# Patient Record
Sex: Male | Born: 1955 | Race: Black or African American | Hispanic: No | Marital: Single | Smoking: Former smoker
Health system: Southern US, Community
[De-identification: ages and names within clinical notes are randomized; demographics above are authoritative.]

## PROBLEM LIST (undated history)

## (undated) DIAGNOSIS — G4730 Sleep apnea, unspecified: Secondary | ICD-10-CM

## (undated) DIAGNOSIS — S82843A Displaced bimalleolar fracture of unspecified lower leg, initial encounter for closed fracture: Secondary | ICD-10-CM

## (undated) DIAGNOSIS — S40812A Abrasion of left upper arm, initial encounter: Secondary | ICD-10-CM

## (undated) HISTORY — DX: Sleep apnea, unspecified: G47.30

---

## 1988-11-06 HISTORY — PX: WISDOM TOOTH EXTRACTION: SHX21

## 1998-06-14 ENCOUNTER — Emergency Department (HOSPITAL_COMMUNITY): Admission: EM | Admit: 1998-06-14 | Discharge: 1998-06-14 | Payer: Self-pay | Admitting: Emergency Medicine

## 2002-01-06 ENCOUNTER — Encounter: Admission: RE | Admit: 2002-01-06 | Discharge: 2002-01-06 | Payer: Self-pay | Admitting: Sports Medicine

## 2003-01-05 ENCOUNTER — Emergency Department (HOSPITAL_COMMUNITY): Admission: EM | Admit: 2003-01-05 | Discharge: 2003-01-06 | Payer: Self-pay

## 2004-07-21 ENCOUNTER — Ambulatory Visit (HOSPITAL_COMMUNITY): Admission: RE | Admit: 2004-07-21 | Discharge: 2004-07-21 | Payer: Self-pay | Admitting: Internal Medicine

## 2008-11-18 ENCOUNTER — Encounter: Admission: RE | Admit: 2008-11-18 | Discharge: 2008-11-18 | Payer: Self-pay | Admitting: Orthopedic Surgery

## 2012-12-27 ENCOUNTER — Ambulatory Visit: Payer: Self-pay | Admitting: General Practice

## 2014-05-20 ENCOUNTER — Encounter: Payer: Self-pay | Admitting: Gastroenterology

## 2014-07-19 ENCOUNTER — Ambulatory Visit (HOSPITAL_BASED_OUTPATIENT_CLINIC_OR_DEPARTMENT_OTHER): Payer: BC Managed Care – PPO | Attending: Internal Medicine | Admitting: Radiology

## 2014-07-19 VITALS — Ht 69.0 in | Wt 260.0 lb

## 2014-07-19 DIAGNOSIS — G4733 Obstructive sleep apnea (adult) (pediatric): Secondary | ICD-10-CM | POA: Diagnosis not present

## 2014-07-23 ENCOUNTER — Ambulatory Visit (AMBULATORY_SURGERY_CENTER): Payer: Self-pay | Admitting: *Deleted

## 2014-07-23 VITALS — Ht 69.0 in | Wt 263.4 lb

## 2014-07-23 DIAGNOSIS — Z1211 Encounter for screening for malignant neoplasm of colon: Secondary | ICD-10-CM

## 2014-07-23 MED ORDER — NA SULFATE-K SULFATE-MG SULF 17.5-3.13-1.6 GM/177ML PO SOLN: ORAL | Status: DC

## 2014-07-23 NOTE — Progress Notes (Signed)
No allergies to eggs or soy. No problems with anesthesia.  Pt given Emmi instructions for colonoscopy  No oxygen use  No diet drug use  

## 2014-07-25 DIAGNOSIS — G4733 Obstructive sleep apnea (adult) (pediatric): Secondary | ICD-10-CM

## 2014-07-25 NOTE — Sleep Study (Signed)
        NAME: Ruben Poole DATE OF BIRTH:  06-17-1956 MEDICAL RECORD NUMBER [id number]  LOCATION: [facility]  PHYSICIAN: Sloane Junkin D  DATE OF STUDY: 07/19/2014  SLEEP STUDY TYPE: Nocturnal Polysomnogram               REFERRING PHYSICIAN: Dorrene German, MD  INDICATION FOR STUDY: Hypersomnia with sleep apnea  EPWORTH SLEEPINESS SCORE:   10/24 HEIGHT:  (175.3 cm)  WEIGHT: 260 lb (117.935 kg)    Body mass index is 38.38 kg/(m^2).  NECK SIZE: 18 in.  MEDICATIONS: Charted for review  SLEEP ARCHITECTURE: Total sleep time 245 minutes with sleep efficiency 54.5%. Stage I was 14.3%, stage II 66.1%, stage III absent, REM 19.6% of total sleep time. Sleep latency 100 minutes, REM latency 162 minutes, awake after sleep onset 104.5 minutes, arousal index 52.9, bedtime medication: None. Significant difficulty initiating sleep, with sustained sleep not noted until almost 1 AM.  RESPIRATORY DATA: Apnea hypopneas index (AHI) 45.3 per hour. 185 total events scored including 84 obstructive apneas, 3 mixed apneas, and 98 hypopneas. Non-positional events. REM AHI 60 per hour. Because of the leg sleep onset, there was not enough early sleep to meet protocol requirements for split CPAP titration.  OXYGEN DATA: Moderate snoring with oxygen desaturation to a nadir of 70% and mean saturation 92.5% on room air.  CARDIAC DATA: Normal sinus rhythm  MOVEMENT/PARASOMNIA: No significant movement disturbance, bathroom x1  IMPRESSION/ RECOMMENDATION:   1) Severe obstructive sleep apnea/hypopneas syndrome, AHI 45.3 per hour with non-positional events. REM AHI 60 per hour. Moderate snoring with oxygen desaturation to a nadir of 70% and mean saturation 92.5% on room air. 2) Sustained sleep was delayed until almost 1 AM. If typical of his usual sleep pattern, this would suggest an insomnia component. 3) Delayed sleep onset prevented application of split protocol CPAP titration. If you  returns for a dedicated CPAP titration study, consider letting him bring a sleep medication to assist with sleep onset.  Waymon Budge Diplomate, American Board of Sleep Medicine  ELECTRONICALLY SIGNED ON:  07/25/2014, 11:23 AM [facility] PH: [phone]   FX: [phone] ACCREDITED BY THE AMERICAN ACADEMY OF SLEEP MEDICINE

## 2014-07-31 ENCOUNTER — Encounter: Payer: Self-pay | Admitting: Gastroenterology

## 2014-08-04 ENCOUNTER — Ambulatory Visit (AMBULATORY_SURGERY_CENTER): Payer: BC Managed Care – PPO | Admitting: Gastroenterology

## 2014-08-04 ENCOUNTER — Encounter: Payer: Self-pay | Admitting: Gastroenterology

## 2014-08-04 VITALS — BP 116/79 | HR 59 | Temp 97.3°F | Resp 14 | Ht 69.0 in | Wt 263.0 lb

## 2014-08-04 DIAGNOSIS — Z1211 Encounter for screening for malignant neoplasm of colon: Secondary | ICD-10-CM

## 2014-08-04 HISTORY — PX: COLONOSCOPY WITH PROPOFOL: SHX5780

## 2014-08-04 MED ORDER — SODIUM CHLORIDE 0.9 % IV SOLN: 500.0000 mL | INTRAVENOUS | Status: DC

## 2014-08-04 NOTE — Patient Instructions (Signed)
YOU HAD AN ENDOSCOPIC PROCEDURE TODAY AT THE [facility]: Refer to the procedure report that was given to you for any specific questions about what was found during the examination.  If the procedure report does not answer your questions, please call your gastroenterologist to clarify.  If you requested that your care partner not be given the details of your procedure findings, then the procedure report has been included in a sealed envelope for you to review at your convenience later.  YOU SHOULD EXPECT: Some feelings of bloating in the abdomen. Passage of more gas than usual.  Walking can help get rid of the air that was put into your GI tract during the procedure and reduce the bloating. If you had a lower endoscopy (such as a colonoscopy or flexible sigmoidoscopy) you may notice spotting of blood in your stool or on the toilet paper. If you underwent a bowel prep for your procedure, then you may not have a normal bowel movement for a few days.  DIET: Your first meal following the procedure should be a light meal and then it is ok to progress to your normal diet.  A half-sandwich or bowl of soup is an example of a good first meal.  Heavy or fried foods are harder to digest and may make you feel nauseous or bloated.  Likewise meals heavy in dairy and vegetables can cause extra gas to form and this can also increase the bloating.  Drink plenty of fluids but you should avoid alcoholic beverages for 24 hours.  ACTIVITY: Your care partner should take you home directly after the procedure.  You should plan to take it easy, moving slowly for the rest of the day.  You can resume normal activity the day after the procedure however you should NOT DRIVE or use heavy machinery for 24 hours (because of the sedation medicines used during the test).    SYMPTOMS TO REPORT IMMEDIATELY: A gastroenterologist can be reached at any hour.  During normal business hours, 8:30 AM to 5:00 PM Monday through Friday,  call [phone].  After hours and on weekends, please call the GI answering service at [phone] who will take a message and have the physician on call contact you.   Following lower endoscopy (colonoscopy or flexible sigmoidoscopy):  Excessive amounts of blood in the stool  Significant tenderness or worsening of abdominal pains  Swelling of the abdomen that is new, acute  Fever of 100F or higher    FOLLOW UP: If any biopsies were taken you will be contacted by phone or by letter within the next 1-3 weeks.  Call your gastroenterologist if you have not heard about the biopsies in 3 weeks.  Our staff will call the home number listed on your records the next business day following your procedure to check on you and address any questions or concerns that you may have at that time regarding the information given to you following your procedure. This is a courtesy call and so if there is no answer at the home number and we have not heard from you through the emergency physician on call, we will assume that you have returned to your regular daily activities without incident.  SIGNATURES/CONFIDENTIALITY: You and/or your care partner have signed paperwork which will be entered into your electronic medical record.  These signatures attest to the fact that that the information above on your After Visit Summary has been reviewed and is understood.  Full responsibility of the confidentiality   of this discharge information lies with you and/or your care-partner.  Normal colonoscopy-Repeat in 10 years-2025. 

## 2014-08-04 NOTE — Progress Notes (Signed)
Patient awakening,vss,report to rn 

## 2014-08-04 NOTE — Op Note (Signed)
[facility] [address] [place], [id number]   COLONOSCOPY PROCEDURE REPORT  PATIENT: Ruben Poole, Ruben Poole  MR#: [id number] BIRTHDATE: Jul 20, 1956 , 58  yrs. old GENDER: male ENDOSCOPIST: Louis Meckelobert D Cordarrius Coad, MD REFERRED OZ:HYQMVBY:Edwin Concepcion ElkAvbuere, M.D. PROCEDURE DATE:  08/04/2014 PROCEDURE:   Colonoscopy, diagnostic First Screening Colonoscopy - Avg.  risk and is 50 yrs.  old or older Yes.  Prior Negative Screening - Now for repeat screening. N/A  History of Adenoma - Now for follow-up colonoscopy & has been > or = to 3 yrs.  N/A  Polyps Removed Today? No.  Recommend repeat exam, <10 yrs? No. ASA CLASS:   Class II INDICATIONS:average risk for colon cancer. MEDICATIONS: Monitored anesthesia care and Propofol 200 mg IV  DESCRIPTION OF PROCEDURE:   After the risks benefits and alternatives of the procedure were thoroughly explained, informed consent was obtained.  The digital rectal exam revealed no abnormalities of the rectum.   The LB [id number]  endoscope was introduced through the anus and advanced to the cecum, which was identified by both the appendix and ileocecal valve. No adverse events experienced.   The quality of the prep was excellent using Suprep  The instrument was then slowly withdrawn as the colon was fully examined.      COLON FINDINGS: A normal appearing cecum, ileocecal valve, and appendiceal orifice were identified.  The ascending, transverse, descending, sigmoid colon, and rectum appeared unremarkable. Retroflexed views revealed no abnormalities. The time to cecum=2 minutes 16 seconds.  Withdrawal time=5 minutes 58 seconds.  The scope was withdrawn and the procedure completed. COMPLICATIONS: There were no immediate complications.  ENDOSCOPIC IMPRESSION: Normal colonoscopy  RECOMMENDATIONS: Continue current colorectal screening recommendations for "routine risk" patients with a repeat colonoscopy in 10 years.  eSigned:  Louis Meckelobert D Samariyah Cowles, MD  08/04/2014 12:04 PM   cc:

## 2014-08-05 ENCOUNTER — Telehealth: Payer: Self-pay | Admitting: *Deleted

## 2014-08-05 NOTE — Telephone Encounter (Signed)
No answer. Left message to call if questions or concerns. 

## 2017-01-31 ENCOUNTER — Emergency Department (HOSPITAL_COMMUNITY): Payer: BLUE CROSS/BLUE SHIELD

## 2017-01-31 ENCOUNTER — Encounter (HOSPITAL_COMMUNITY): Payer: Self-pay

## 2017-01-31 ENCOUNTER — Emergency Department (HOSPITAL_COMMUNITY)
Admission: EM | Admit: 2017-01-31 | Discharge: 2017-01-31 | Disposition: A | Discharge status: Home / Self Care | Payer: BLUE CROSS/BLUE SHIELD | Attending: Emergency Medicine | Admitting: Emergency Medicine

## 2017-01-31 DIAGNOSIS — Y999 Unspecified external cause status: Secondary | ICD-10-CM | POA: Insufficient documentation

## 2017-01-31 DIAGNOSIS — W1789XA Other fall from one level to another, initial encounter: Secondary | ICD-10-CM | POA: Diagnosis not present

## 2017-01-31 DIAGNOSIS — Y9389 Activity, other specified: Secondary | ICD-10-CM | POA: Insufficient documentation

## 2017-01-31 DIAGNOSIS — Y929 Unspecified place or not applicable: Secondary | ICD-10-CM | POA: Insufficient documentation

## 2017-01-31 DIAGNOSIS — F17290 Nicotine dependence, other tobacco product, uncomplicated: Secondary | ICD-10-CM | POA: Diagnosis not present

## 2017-01-31 DIAGNOSIS — IMO0001 Reserved for inherently not codable concepts without codable children: Secondary | ICD-10-CM

## 2017-01-31 DIAGNOSIS — S82842A Displaced bimalleolar fracture of left lower leg, initial encounter for closed fracture: Secondary | ICD-10-CM | POA: Diagnosis not present

## 2017-01-31 DIAGNOSIS — S99912A Unspecified injury of left ankle, initial encounter: Secondary | ICD-10-CM | POA: Diagnosis present

## 2017-01-31 DIAGNOSIS — S82843A Displaced bimalleolar fracture of unspecified lower leg, initial encounter for closed fracture: Secondary | ICD-10-CM

## 2017-01-31 DIAGNOSIS — S40812A Abrasion of left upper arm, initial encounter: Secondary | ICD-10-CM

## 2017-01-31 HISTORY — DX: Abrasion of left upper arm, initial encounter: S40.812A

## 2017-01-31 HISTORY — DX: Displaced bimalleolar fracture of unspecified lower leg, initial encounter for closed fracture: S82.843A

## 2017-01-31 MED ORDER — FENTANYL CITRATE (PF) 100 MCG/2ML IJ SOLN
50.0000 ug | Freq: Once | INTRAMUSCULAR | Status: AC
  Administered 2017-01-31: 50 ug via INTRAVENOUS
  Filled 2017-01-31: qty 2

## 2017-01-31 MED ORDER — OXYCODONE-ACETAMINOPHEN 5-325 MG PO TABS: 1.0000 | ORAL_TABLET | ORAL | 0 refills | Status: DC | PRN

## 2017-01-31 MED ORDER — PROPOFOL 10 MG/ML IV BOLUS
1.0000 mg/kg | Freq: Once | INTRAVENOUS | Status: AC
  Administered 2017-01-31: 100 mg via INTRAVENOUS
  Filled 2017-01-31: qty 20

## 2017-01-31 NOTE — ED Notes (Signed)
Bed: WA20 Expected date:  Expected time:  Means of arrival:  Comments: EMS/ankle deformity

## 2017-01-31 NOTE — ED Provider Notes (Signed)
[facility]-EMERGENCY DEPT Provider Note   CSN: [id number] Arrival date & time: 01/31/17  1303     History   Chief Complaint Chief Complaint  Patient presents with  . Ankle Injury    HPI Ruben Poole is a 61 y.o. male.  The history is provided by the patient and medical records.  Ankle Injury     61 year old male here with left ankle injury. He was cleaning up a tree that had fallen and fell off of his back deck into some loose wood.  Deck is approx 8 feet high.  States immediately when he fell he had deformity to the left ankle. States he crawled back up the hill and called for help. He has not tried to walk on his left ankle. Ankle was splinted by EMS and given 100 g of fentanyl en route to the ED which has helped with pain.  Patient denies any prior left ankle injuries. He denies any current numbness or tingling of his left foot. States pain is controlled as long as he does not move. He denies any other injuries. No head injury or loss of consciousness. Patient is not currently on any anticoagulation. He has not had anything to eat or drink thus far today.  Past Medical History:  Diagnosis Date  . Sleep apnea    is being evaluated currently 07/23/14    There are no active problems to display for this patient.   Past Surgical History:  Procedure Laterality Date  . COLONOSCOPY    . WISDOM TOOTH EXTRACTION  1990       Home Medications    Prior to Admission medications   Medication Sig Start Date End Date Taking? Authorizing Provider  Multiple Vitamin (MULTIVITAMIN) tablet Take 1 tablet by mouth daily.    Historical Provider, MD  Omega-3 Fatty Acids (FISH OIL) 1000 MG CAPS Take by mouth daily.    Historical Provider, MD    Family History Family History  Problem Relation Age of Onset  . Colon cancer Neg Hx     Social History Social History  Substance Use Topics  . Smoking status: Current Some Day Smoker    Types: Cigars  . Smokeless tobacco: Never Used  .  Alcohol use Yes     Comment: drinks 2 drinks liquor a month     Allergies   Patient has no known allergies.   Review of Systems Review of Systems  Musculoskeletal: Positive for arthralgias and joint swelling.  All other systems reviewed and are negative.    Physical Exam Updated Vital Signs BP (!) 152/97 (BP Location: Left Arm)   Pulse 93   Temp 98.7 F (37.1 C) (Oral)   Resp 16   Ht 5\' 10"  (1.778 m)   Wt 117.9 kg   SpO2 99%   BMI 37.31 kg/m   Physical Exam  Constitutional: He is oriented to person, place, and time. He appears well-developed and well-nourished.  HENT:  Head: Normocephalic and atraumatic.  Mouth/Throat: Oropharynx is clear and moist.  Eyes: Conjunctivae and EOM are normal. Pupils are equal, round, and reactive to light.  Neck: Normal range of motion.  Cardiovascular: Normal rate, regular rhythm and normal heart sounds.   Pulmonary/Chest: Effort normal and breath sounds normal. No respiratory distress. He has no wheezes.  Abdominal: Soft. Bowel sounds are normal.  Musculoskeletal:       Left ankle: He exhibits decreased range of motion, swelling and deformity. Tenderness. Medial malleolus tenderness found.  Left ankle deformed along  medial aspect; there is no skin tenting or open wounds; DP pulse remains intact; able to move toes normally; foot is warm, well perfused  Neurological: He is alert and oriented to person, place, and time.  Skin: Skin is warm and dry.  Psychiatric: He has a normal mood and affect.  Nursing note and vitals reviewed.    ED Treatments / Results  Labs (all labs ordered are listed, but only abnormal results are displayed) Labs Reviewed - No data to display  EKG  EKG Interpretation None       Radiology Dg Ankle 2 Views Left  Result Date: 01/31/2017 CLINICAL DATA:  Closed reduction LEFT ankle fracture. EXAM: LEFT ANKLE - 2 VIEW COMPARISON:  LEFT ankle radiograph January 31, 2017 at 1425 hours FINDINGS: Acute medial  malleolus with slight lateral angulation. Nondisplaced acute distal fibular fracture. Mildly widened medial clear space, improved from prior examination. No dislocation. No destructive bony lesions. Soft tissue swelling. Thick fiberglass splint obscures the fine bony detail. IMPRESSION: Acute medial malleolus and distal fibular fractures in improved alignment, in fiberglass splint. Electronically Signed   By: Awilda Metro M.D.   On: 01/31/2017 16:22   Dg Ankle Complete Left  Result Date: 01/31/2017 CLINICAL DATA:  Larey Seat off deck today while cutting trees. Ankle deformity and soft tissue swelling. EXAM: LEFT ANKLE COMPLETE - 3+ VIEW COMPARISON:  None. FINDINGS: Transverse fracture through medial malleolus with lateral displacement distal bony fragments. Comminuted oblique fracture of distal fibula with lateral angulation of distal bony fragments. Disrupted ankle mortise and lateral clear space. Tibial dome is laterally displaced with respect to the tibial plafond . Ankle soft tissue swelling without subcutaneous gas or radiopaque foreign bodies. IMPRESSION: Acute displaced ankle fracture (medial malleolus and distal fibula) and dislocation. Electronically Signed   By: Awilda Metro M.D.   On: 01/31/2017 15:10   Ct Ankle Left Wo Contrast  Result Date: 01/31/2017 CLINICAL DATA:  Bimalleolar fracture of the left ankle EXAM: CT OF THE LEFT ANKLE WITHOUT CONTRAST TECHNIQUE: Multidetector CT imaging of the left ankle was performed according to the standard protocol. Multiplanar CT image reconstructions were also generated. COMPARISON:  None. FINDINGS: Bones/Joint/Cartilage Acute, closed, bimalleolar fractures of the ankle are identified. The fibular fracture is slightly comminuted with main fracture coronal oblique in orientation extending into the ankle joint. There is a 14 mm length dorsally displaced fracture off the upper portion of the fibular fracture. The fracture involving the medial malleolus of  the tibia involves the anterolateral corner and there is slight medial displacement of the medial malleolus by approximately 5 mm. The ankle mortise is minimally widened along its medial aspect as a result of to 6 mm but grossly intact. There are tiny ossific densities posterior to the posterior malleolus without donor site, possibly related to old remote trauma. The visualized talus, os calcaneus and midfoot are intact. Ligaments Suboptimally assessed by CT. Muscles and Tendons No acute intramuscular hemorrhage nor evidence of compartment syndrome. The visualized flexor and extensor tendons are grossly intact. Soft tissues Expected degree of moderate subcutaneous soft tissue edema and induration about the ankle secondary to fracture. IMPRESSION: 1. Acute, closed, bimalleolar fractures of the ankle. 2. The fibular fracture is slightly comminuted proximally with slight dorsal displacement of a 14 mm in length fracture fragment. Slight medial displacement of the medial malleolar fracture by 5 mm. 3. Slight asymmetric widening of the ankle mortise medially to 6 mm. Electronically Signed   By: Tollie Eth M.D.   On:  01/31/2017 18:38   Dg Ankle Left Port  Result Date: 01/31/2017 CLINICAL DATA:  Post reduction EXAM: PORTABLE LEFT ANKLE - 1 VIEW COMPARISON:  Portable exam 1703 hours compared to 1606 hours FINDINGS: Single lateral portable cross-table view. Oblique minimally displaced distal fibular fracture. Minimally displaced medial malleolar fracture. No additional fractures identified. No dislocation. IMPRESSION: Minimally displaced bimalleolar fractures of the LEFT ankle. Electronically Signed   By: Ulyses Southward M.D.   On: 01/31/2017 17:39    Procedures Reduction of dislocation Date/Time: 01/31/2017 4:13 PM Performed by: Garlon Hatchet Authorized by: Garlon Hatchet  Consent: Verbal consent obtained. Risks and benefits: risks, benefits and alternatives were discussed Consent given by: patient Patient  understanding: patient states understanding of the procedure being performed Patient consent: the patient's understanding of the procedure matches consent given Procedure consent: procedure consent matches procedure scheduled Required items: required blood products, implants, devices, and special equipment available Patient identity confirmed: verbally with patient Time out: Immediately prior to procedure a "time out" was called to verify the correct patient, procedure, equipment, support staff and site/side marked as required. Local anesthesia used: no  Anesthesia: Local anesthesia used: no  Sedation: Patient sedated: yes Sedation type: moderate (conscious) sedation Sedatives: propofol Analgesia: fentanyl Sedation start date/time: 01/31/2017 3:50 PM Sedation end date/time: 01/31/2017 4:05 PM Vitals: Vital signs were monitored during sedation. Patient tolerance: Patient tolerated the procedure well with no immediate complications Comments: Alignment improved, however fracture remains very unstable.  Splint applied.  .Sedation Date/Time: 01/31/2017 4:15 PM Performed by: Garlon Hatchet Authorized by: Garlon Hatchet   Consent:    Consent obtained:  Verbal and written   Consent given by:  Patient   Risks discussed:  Allergic reaction, prolonged hypoxia resulting in organ damage, dysrhythmia, prolonged sedation necessitating reversal and nausea   Alternatives discussed:  Analgesia without sedation and anxiolysis Indications:    Procedure performed:  Dislocation reduction   Procedure necessitating sedation performed by:  Physician performing sedation   Intended level of sedation:  Moderate (conscious sedation) Pre-sedation assessment:    Time since last food or drink:  Last PM   ASA classification: class 1 - normal, healthy patient     Neck mobility: normal     Mouth opening:  3 or more finger widths   Mallampati score:  I - soft palate, uvula, fauces, pillars visible    Pre-sedation assessments completed and reviewed: airway patency, hydration status, mental status, pain level and respiratory function   Immediate pre-procedure details:    Reassessment: Patient reassessed immediately prior to procedure     Reviewed: vital signs, relevant labs/tests and NPO status     Verified: bag valve mask available, emergency equipment available, intubation equipment available, IV patency confirmed and oxygen available   Procedure details (see MAR for exact dosages):    Sedation start time:  01/31/2017 3:50 PM   Preoxygenation:  Nasal cannula   Sedation:  Propofol   Analgesia:  Fentanyl   Intra-procedure monitoring:  Blood pressure monitoring, continuous pulse oximetry, cardiac monitor and frequent vital sign checks   Intra-procedure events: none     Sedation end time:  01/31/2017 4:05 PM   Total sedation time (minutes):  15 Post-procedure details:    Post-sedation assessment completed:  01/31/2017 4:10 PM   Attendance: Constant attendance by certified staff until patient recovered     Recovery: Patient returned to pre-procedure baseline     Complications:  None   Specimens recovered:  None   Patient is  stable for discharge or admission: yes     Patient tolerance:  Tolerated well, no immediate complications   (including critical care time)  Medications Ordered in ED Medications - No data to display   Initial Impression / Assessment and Plan / ED Course  I have reviewed the triage vital signs and the nursing notes.  Pertinent labs & imaging results that were available during my care of the patient were reviewed by me and considered in my medical decision making (see chart for details).  61 year old male here with left ankle injury after falling off his deck that is approximately 8 feet high. Denies any head injury or loss of consciousness.  On exam he has an obvious deformity of the left medial ankle. DP pulse remains intact. He is able to move toes normally and has  normal sensation. Initial x-ray with bimalleolar fracture as well as dislocation. Risks and benefits of conscious sedation and closed reduction here discussed with patient, he elected to proceed. He was sedated with propofol. Vitals were monitored during procedure without any noted events. Alignment was improved and patient's ankle was splinted. Repeat films with improved alignment. Patient returned to baseline shortly after, tolerated procedure well without any acute complications. Case was discussed with on-call orthopedics, Dr. Linna CapriceSwinteck-- will refer patient to have a CT of ankle here prior to discharge. He will see patient in clinic on Mon/Tue next week, plan for surgery lateral next week.    CT of ankle was obtained here. Patient will be discharged home with Percocet for pain control, Motrin in between doses. Recommended to elevate ankle at home to help with swelling. He was given contact information for Dr. Kathline MagicSwinteck's office-- wife will make his appt in the morning.  Discussed plan with patient, he acknowledged understanding and agreed with plan of care.  Return precautions given for new or worsening symptoms.  Final Clinical Impressions(s) / ED Diagnoses   Final diagnoses:  Dislocation  Closed bimalleolar fracture of left ankle, initial encounter    New Prescriptions Discharge Medication List as of 01/31/2017  6:50 PM    START taking these medications   Details  oxyCODONE-acetaminophen (PERCOCET) 5-325 MG tablet Take 1 tablet by mouth every 4 (four) hours as needed., Starting Wed 01/31/2017, Print         Garlon HatchetLisa M Wesam Gearhart, PA-C 01/31/17 2025    Loren Raceravid Yelverton, MD 02/01/17 [phone]

## 2017-01-31 NOTE — Discharge Instructions (Signed)
Take the prescribed medication as directed.  May take motrin between doses to help with pain/swelling.  Elevate your ankle at home above level of the heart.  Pain medicine can make you drowsy so use with caution. Follow-up with Dr. Linna CapriceSwinteck-- he wants to see you on Monday or Tuesday of next week in clinic.  You can call tomorrow to set up this appt. Return to the ED for new or worsening symptoms.

## 2017-01-31 NOTE — ED Notes (Signed)
Bed: WA15 Expected date:  Expected time:  Means of arrival:  Comments: EMS-ankle deformity 

## 2017-01-31 NOTE — ED Triage Notes (Addendum)
Per EMS- Patient was cutting trees and fell off of his deck (approx. 8 feet). Patient has swelling, deformity, pain to the left ankle. Ankle splinted. EMS gave FEntanyl 100 mcg IV prior to arrival to the ED.

## 2017-01-31 NOTE — ED Notes (Signed)
This RN accidentally typed 18% into SpO2. Incorrect.

## 2017-02-06 ENCOUNTER — Other Ambulatory Visit: Payer: Self-pay | Admitting: Orthopedic Surgery

## 2017-02-06 ENCOUNTER — Encounter (HOSPITAL_BASED_OUTPATIENT_CLINIC_OR_DEPARTMENT_OTHER): Payer: Self-pay | Admitting: *Deleted

## 2017-02-06 NOTE — Progress Notes (Signed)
   02/06/17 1541  OBSTRUCTIVE SLEEP APNEA  Have you ever been diagnosed with sleep apnea through a sleep study? Yes (07/2014 - severe sleep apnea; did not go back for followup)  If yes, do you have and use a CPAP or BPAP machine every night? 0  Do you snore loudly (loud enough to be heard through closed doors)?  1  Do you often feel tired, fatigued, or sleepy during the daytime (such as falling asleep during driving or talking to someone)? 0  Has anyone observed you stop breathing during your sleep? 1  Do you have, or are you being treated for high blood pressure? 0  BMI more than 35 kg/m2? 1  Age > 50 (1-yes) 1  Male Gender (Yes=1) 1  Obstructive Sleep Apnea Score 5  Score 5 or greater  Results sent to PCP

## 2017-02-07 ENCOUNTER — Encounter (HOSPITAL_BASED_OUTPATIENT_CLINIC_OR_DEPARTMENT_OTHER): Payer: Self-pay | Admitting: *Deleted

## 2017-02-08 ENCOUNTER — Ambulatory Visit (HOSPITAL_BASED_OUTPATIENT_CLINIC_OR_DEPARTMENT_OTHER): Payer: BLUE CROSS/BLUE SHIELD | Admitting: Certified Registered"

## 2017-02-08 ENCOUNTER — Ambulatory Visit (HOSPITAL_BASED_OUTPATIENT_CLINIC_OR_DEPARTMENT_OTHER)
Admission: RE | Admit: 2017-02-08 | Discharge: 2017-02-08 | Disposition: A | Discharge status: Home / Self Care | Payer: BLUE CROSS/BLUE SHIELD | Source: Ambulatory Visit | Attending: Orthopedic Surgery | Admitting: Orthopedic Surgery

## 2017-02-08 ENCOUNTER — Encounter (HOSPITAL_BASED_OUTPATIENT_CLINIC_OR_DEPARTMENT_OTHER): Payer: Self-pay | Admitting: Certified Registered"

## 2017-02-08 ENCOUNTER — Encounter (HOSPITAL_BASED_OUTPATIENT_CLINIC_OR_DEPARTMENT_OTHER)
Admission: RE | Disposition: A | Discharge status: Home / Self Care | Payer: Self-pay | Source: Ambulatory Visit | Attending: Orthopedic Surgery

## 2017-02-08 DIAGNOSIS — S82842A Displaced bimalleolar fracture of left lower leg, initial encounter for closed fracture: Secondary | ICD-10-CM | POA: Diagnosis not present

## 2017-02-08 DIAGNOSIS — M25572 Pain in left ankle and joints of left foot: Secondary | ICD-10-CM | POA: Diagnosis present

## 2017-02-08 DIAGNOSIS — Z87891 Personal history of nicotine dependence: Secondary | ICD-10-CM | POA: Diagnosis not present

## 2017-02-08 DIAGNOSIS — G4730 Sleep apnea, unspecified: Secondary | ICD-10-CM | POA: Insufficient documentation

## 2017-02-08 DIAGNOSIS — Z6837 Body mass index (BMI) 37.0-37.9, adult: Secondary | ICD-10-CM | POA: Insufficient documentation

## 2017-02-08 DIAGNOSIS — W1789XA Other fall from one level to another, initial encounter: Secondary | ICD-10-CM | POA: Insufficient documentation

## 2017-02-08 DIAGNOSIS — Z79899 Other long term (current) drug therapy: Secondary | ICD-10-CM | POA: Insufficient documentation

## 2017-02-08 DIAGNOSIS — Z791 Long term (current) use of non-steroidal anti-inflammatories (NSAID): Secondary | ICD-10-CM | POA: Insufficient documentation

## 2017-02-08 HISTORY — PX: ORIF ANKLE FRACTURE: SHX5408

## 2017-02-08 HISTORY — DX: Displaced bimalleolar fracture of unspecified lower leg, initial encounter for closed fracture: S82.843A

## 2017-02-08 HISTORY — DX: Abrasion of left upper arm, initial encounter: S40.812A

## 2017-02-08 SURGERY — OPEN REDUCTION INTERNAL FIXATION (ORIF) ANKLE FRACTURE
Anesthesia: Regional | Site: Ankle | Laterality: Left

## 2017-02-08 MED ORDER — ROPIVACAINE HCL 5 MG/ML IJ SOLN
INTRAMUSCULAR | Status: DC | PRN
  Administered 2017-02-08: 15 mL via PERINEURAL

## 2017-02-08 MED ORDER — ASPIRIN EC 81 MG PO TBEC: 81.0000 mg | DELAYED_RELEASE_TABLET | Freq: Two times a day (BID) | ORAL | 0 refills | Status: AC

## 2017-02-08 MED ORDER — SENNA 8.6 MG PO TABS: 2.0000 | ORAL_TABLET | Freq: Two times a day (BID) | ORAL | 0 refills | Status: AC

## 2017-02-08 MED ORDER — BUPIVACAINE HCL (PF) 0.5 % IJ SOLN
INTRAMUSCULAR | Status: AC
  Filled 2017-02-08: qty 30

## 2017-02-08 MED ORDER — FENTANYL CITRATE (PF) 100 MCG/2ML IJ SOLN
50.0000 ug | INTRAMUSCULAR | Status: DC | PRN
  Administered 2017-02-08: 100 ug via INTRAVENOUS

## 2017-02-08 MED ORDER — CHLORHEXIDINE GLUCONATE 4 % EX LIQD: 60.0000 mL | Freq: Once | CUTANEOUS | Status: DC

## 2017-02-08 MED ORDER — 0.9 % SODIUM CHLORIDE (POUR BTL) OPTIME
TOPICAL | Status: DC | PRN
  Administered 2017-02-08: 200 mL

## 2017-02-08 MED ORDER — OXYCODONE HCL 5 MG PO TABS: 5.0000 mg | ORAL_TABLET | ORAL | 0 refills | Status: AC | PRN

## 2017-02-08 MED ORDER — EPHEDRINE SULFATE 50 MG/ML IJ SOLN
INTRAMUSCULAR | Status: DC | PRN
  Administered 2017-02-08 (×2): 10 mg via INTRAVENOUS

## 2017-02-08 MED ORDER — SCOPOLAMINE 1 MG/3DAYS TD PT72: 1.0000 | MEDICATED_PATCH | Freq: Once | TRANSDERMAL | Status: DC | PRN

## 2017-02-08 MED ORDER — PROPOFOL 500 MG/50ML IV EMUL
INTRAVENOUS | Status: AC
  Filled 2017-02-08: qty 50

## 2017-02-08 MED ORDER — BUPIVACAINE-EPINEPHRINE (PF) 0.5% -1:200000 IJ SOLN
INTRAMUSCULAR | Status: DC | PRN
  Administered 2017-02-08: 30 mL via PERINEURAL

## 2017-02-08 MED ORDER — DOCUSATE SODIUM 100 MG PO CAPS: 100.0000 mg | ORAL_CAPSULE | Freq: Two times a day (BID) | ORAL | 0 refills | Status: AC

## 2017-02-08 MED ORDER — MIDAZOLAM HCL 2 MG/2ML IJ SOLN
INTRAMUSCULAR | Status: AC
  Filled 2017-02-08: qty 2

## 2017-02-08 MED ORDER — EPINEPHRINE 30 MG/30ML IJ SOLN
INTRAMUSCULAR | Status: AC
  Filled 2017-02-08: qty 1

## 2017-02-08 MED ORDER — LACTATED RINGERS IV SOLN
INTRAVENOUS | Status: DC
  Administered 2017-02-08 (×2): via INTRAVENOUS

## 2017-02-08 MED ORDER — HYDROMORPHONE HCL 1 MG/ML IJ SOLN: 0.2500 mg | INTRAMUSCULAR | Status: DC | PRN

## 2017-02-08 MED ORDER — LIDOCAINE HCL (CARDIAC) 20 MG/ML IV SOLN
INTRAVENOUS | Status: DC | PRN
  Administered 2017-02-08: 30 mg via INTRAVENOUS

## 2017-02-08 MED ORDER — DEXAMETHASONE SODIUM PHOSPHATE 10 MG/ML IJ SOLN
INTRAMUSCULAR | Status: AC
  Filled 2017-02-08: qty 2

## 2017-02-08 MED ORDER — OXYCODONE HCL 5 MG PO TABS: 5.0000 mg | ORAL_TABLET | Freq: Once | ORAL | Status: DC | PRN

## 2017-02-08 MED ORDER — CEFAZOLIN SODIUM-DEXTROSE 2-4 GM/100ML-% IV SOLN
INTRAVENOUS | Status: AC
  Filled 2017-02-08: qty 100

## 2017-02-08 MED ORDER — DEXAMETHASONE SODIUM PHOSPHATE 10 MG/ML IJ SOLN
INTRAMUSCULAR | Status: DC | PRN
  Administered 2017-02-08: 10 mg via INTRAVENOUS

## 2017-02-08 MED ORDER — MIDAZOLAM HCL 2 MG/2ML IJ SOLN
1.0000 mg | INTRAMUSCULAR | Status: DC | PRN
  Administered 2017-02-08: 2 mg via INTRAVENOUS

## 2017-02-08 MED ORDER — ONDANSETRON HCL 4 MG/2ML IJ SOLN
INTRAMUSCULAR | Status: AC
  Filled 2017-02-08: qty 8

## 2017-02-08 MED ORDER — CEFAZOLIN SODIUM-DEXTROSE 2-4 GM/100ML-% IV SOLN
2.0000 g | INTRAVENOUS | Status: AC
  Administered 2017-02-08: 2 g via INTRAVENOUS

## 2017-02-08 MED ORDER — OXYCODONE HCL 5 MG/5ML PO SOLN: 5.0000 mg | Freq: Once | ORAL | Status: DC | PRN

## 2017-02-08 MED ORDER — ONDANSETRON HCL 4 MG/2ML IJ SOLN
INTRAMUSCULAR | Status: DC | PRN
  Administered 2017-02-08: 4 mg via INTRAVENOUS

## 2017-02-08 MED ORDER — LIDOCAINE 2% (20 MG/ML) 5 ML SYRINGE
INTRAMUSCULAR | Status: AC
  Filled 2017-02-08: qty 10

## 2017-02-08 MED ORDER — FENTANYL CITRATE (PF) 100 MCG/2ML IJ SOLN
INTRAMUSCULAR | Status: AC
  Filled 2017-02-08: qty 2

## 2017-02-08 MED ORDER — PROPOFOL 10 MG/ML IV BOLUS
INTRAVENOUS | Status: DC | PRN
  Administered 2017-02-08: 200 mg via INTRAVENOUS

## 2017-02-08 SURGICAL SUPPLY — 80 items
BANDAGE ESMARK 6X9 LF (GAUZE/BANDAGES/DRESSINGS) IMPLANT
BIT DRILL 2.5X2.75 QC CALB (BIT) ×3 IMPLANT
BIT DRILL 2.9 CANN QC NONSTRL (BIT) ×3 IMPLANT
BIT DRILL 3.5X5.5 QC CALB (BIT) ×3 IMPLANT
BLADE SURG 15 STRL LF DISP TIS (BLADE) ×2 IMPLANT
BLADE SURG 15 STRL SS (BLADE) ×4
BNDG COHESIVE 4X5 TAN STRL (GAUZE/BANDAGES/DRESSINGS) ×3 IMPLANT
BNDG COHESIVE 6X5 TAN STRL LF (GAUZE/BANDAGES/DRESSINGS) ×3 IMPLANT
BNDG ESMARK 4X9 LF (GAUZE/BANDAGES/DRESSINGS) IMPLANT
BNDG ESMARK 6X9 LF (GAUZE/BANDAGES/DRESSINGS)
CANISTER SUCT 1200ML W/VALVE (MISCELLANEOUS) ×3 IMPLANT
CHLORAPREP W/TINT 26ML (MISCELLANEOUS) ×3 IMPLANT
COVER BACK TABLE 60X90IN (DRAPES) ×3 IMPLANT
CUFF TOURNIQUET SINGLE 34IN LL (TOURNIQUET CUFF) IMPLANT
CUFF TOURNIQUET SINGLE 44IN (TOURNIQUET CUFF) ×3 IMPLANT
DECANTER SPIKE VIAL GLASS SM (MISCELLANEOUS) IMPLANT
DRAPE EXTREMITY T 121X128X90 (DRAPE) ×3 IMPLANT
DRAPE OEC MINIVIEW 54X84 (DRAPES) ×3 IMPLANT
DRAPE U-SHAPE 47X51 STRL (DRAPES) ×3 IMPLANT
DRSG MEPITEL 4X7.2 (GAUZE/BANDAGES/DRESSINGS) ×3 IMPLANT
DRSG PAD ABDOMINAL 8X10 ST (GAUZE/BANDAGES/DRESSINGS) ×6 IMPLANT
ELECT REM PT RETURN 9FT ADLT (ELECTROSURGICAL) ×3
ELECTRODE REM PT RTRN 9FT ADLT (ELECTROSURGICAL) ×1 IMPLANT
GAUZE SPONGE 4X4 12PLY STRL (GAUZE/BANDAGES/DRESSINGS) ×3 IMPLANT
GLOVE BIO SURGEON STRL SZ8 (GLOVE) ×3 IMPLANT
GLOVE BIOGEL PI IND STRL 6.5 (GLOVE) ×1 IMPLANT
GLOVE BIOGEL PI IND STRL 7.0 (GLOVE) ×1 IMPLANT
GLOVE BIOGEL PI IND STRL 8 (GLOVE) ×2 IMPLANT
GLOVE BIOGEL PI INDICATOR 6.5 (GLOVE) ×2
GLOVE BIOGEL PI INDICATOR 7.0 (GLOVE) ×2
GLOVE BIOGEL PI INDICATOR 8 (GLOVE) ×4
GLOVE ECLIPSE 6.5 STRL STRAW (GLOVE) ×3 IMPLANT
GLOVE ECLIPSE 8.0 STRL XLNG CF (GLOVE) ×3 IMPLANT
GOWN STRL REUS W/ TWL LRG LVL3 (GOWN DISPOSABLE) ×1 IMPLANT
GOWN STRL REUS W/ TWL XL LVL3 (GOWN DISPOSABLE) ×2 IMPLANT
GOWN STRL REUS W/TWL LRG LVL3 (GOWN DISPOSABLE) ×2
GOWN STRL REUS W/TWL XL LVL3 (GOWN DISPOSABLE) ×4
K-WIRE ACE 1.6X6 (WIRE) ×6
KWIRE ACE 1.6X6 (WIRE) ×2 IMPLANT
NEEDLE HYPO 22GX1.5 SAFETY (NEEDLE) IMPLANT
NS IRRIG 1000ML POUR BTL (IV SOLUTION) ×3 IMPLANT
PACK BASIN DAY SURGERY FS (CUSTOM PROCEDURE TRAY) ×3 IMPLANT
PAD CAST 4YDX4 CTTN HI CHSV (CAST SUPPLIES) ×1 IMPLANT
PADDING CAST ABS 4INX4YD NS (CAST SUPPLIES) ×2
PADDING CAST ABS COTTON 4X4 ST (CAST SUPPLIES) ×1 IMPLANT
PADDING CAST COTTON 4X4 STRL (CAST SUPPLIES) ×2
PADDING CAST COTTON 6X4 STRL (CAST SUPPLIES) ×3 IMPLANT
PENCIL BUTTON HOLSTER BLD 10FT (ELECTRODE) ×3 IMPLANT
PLATE ACE 100DEG 7HOLE (Plate) ×3 IMPLANT
SANITIZER HAND PURELL 535ML FO (MISCELLANEOUS) ×3 IMPLANT
SCREW ACE CAN 4.0 40M (Screw) ×6 IMPLANT
SCREW CORTICAL 3.5MM  16MM (Screw) ×4 IMPLANT
SCREW CORTICAL 3.5MM  20MM (Screw) ×4 IMPLANT
SCREW CORTICAL 3.5MM 16MM (Screw) ×2 IMPLANT
SCREW CORTICAL 3.5MM 18MM (Screw) ×3 IMPLANT
SCREW CORTICAL 3.5MM 20MM (Screw) ×2 IMPLANT
SCREW CORTICAL 3.5MM 24MM (Screw) ×3 IMPLANT
SCREW CORTICAL 3.5MM 36MM (Screw) ×3 IMPLANT
SHEET MEDIUM DRAPE 40X70 STRL (DRAPES) ×3 IMPLANT
SLEEVE SCD COMPRESS KNEE MED (MISCELLANEOUS) ×3 IMPLANT
SPLINT FAST PLASTER 5X30 (CAST SUPPLIES) ×40
SPLINT PLASTER CAST FAST 5X30 (CAST SUPPLIES) ×20 IMPLANT
SPONGE LAP 18X18 X RAY DECT (DISPOSABLE) ×3 IMPLANT
STOCKINETTE 6  STRL (DRAPES) ×2
STOCKINETTE 6 STRL (DRAPES) ×1 IMPLANT
SUCTION FRAZIER HANDLE 10FR (MISCELLANEOUS) ×2
SUCTION TUBE FRAZIER 10FR DISP (MISCELLANEOUS) ×1 IMPLANT
SUT ETHILON 3 0 PS 1 (SUTURE) ×6 IMPLANT
SUT FIBERWIRE #2 38 T-5 BLUE (SUTURE)
SUT MNCRL AB 3-0 PS2 18 (SUTURE) ×3 IMPLANT
SUT VIC AB 0 SH 27 (SUTURE) IMPLANT
SUT VIC AB 2-0 SH 27 (SUTURE) ×2
SUT VIC AB 2-0 SH 27XBRD (SUTURE) ×1 IMPLANT
SUTURE FIBERWR #2 38 T-5 BLUE (SUTURE) IMPLANT
SYR BULB 3OZ (MISCELLANEOUS) ×3 IMPLANT
SYR CONTROL 10ML LL (SYRINGE) IMPLANT
TOWEL OR 17X24 6PK STRL BLUE (TOWEL DISPOSABLE) ×6 IMPLANT
TUBE CONNECTING 20'X1/4 (TUBING) ×1
TUBE CONNECTING 20X1/4 (TUBING) ×2 IMPLANT
UNDERPAD 30X30 (UNDERPADS AND DIAPERS) ×3 IMPLANT

## 2017-02-08 NOTE — Discharge Instructions (Addendum)
Post Anesthesia Home Care Instructions  Activity: Get plenty of rest for the remainder of the day. A responsible individual must stay with you for 24 hours following the procedure.  For the next 24 hours, DO NOT: -Drive a car -Advertising copywriter -Drink alcoholic beverages -Take any medication unless instructed by your physician -Make any legal decisions or sign important papers.  Meals: Start with liquid foods such as gelatin or soup. Progress to regular foods as tolerated. Avoid greasy, spicy, heavy foods. If nausea and/or vomiting occur, drink only clear liquids until the nausea and/or vomiting subsides. Call your physician if vomiting continues.  Special Instructions/Symptoms: Your throat may feel dry or sore from the anesthesia or the breathing tube placed in your throat during surgery. If this causes discomfort, gargle with warm salt water. The discomfort should disappear within 24 hours.  If you had a scopolamine patch placed behind your ear for the management of post- operative nausea and/or vomiting:  1. The medication in the patch is effective for 72 hours, after which it should be removed.  Wrap patch in a tissue and discard in the trash. Wash hands thoroughly with soap and water. 2. You may remove the patch earlier than 72 hours if you experience unpleasant side effects which may include dry mouth, dizziness or visual disturbances. 3. Avoid touching the patch. Wash your hands with soap and water after contact with the patch.       Regional Anesthesia Blocks  1. Numbness or the inability to move the "blocked" extremity may last from 3-48 hours after placement. The length of time depends on the medication injected and your individual response to the medication. If the numbness is not going away after 48 hours, call your surgeon.  2. The extremity that is blocked will need to be protected until the numbness is gone and the  Strength has returned. Because you cannot feel it,  you will need to take extra care to avoid injury. Because it may be weak, you may have difficulty moving it or using it. You may not know what position it is in without looking at it while the block is in effect.  3. For blocks in the legs and feet, returning to weight bearing and walking needs to be done carefully. You will need to wait until the numbness is entirely gone and the strength has returned. You should be able to move your leg and foot normally before you try and bear weight or walk. You will need someone to be with you when you first try to ensure you do not fall and possibly risk injury.  4. Bruising and tenderness at the needle site are common side effects and will resolve in a few days.  5. Persistent numbness or new problems with movement should be communicated to the surgeon or the [facility] [phone] [facility] [phone]).     Toni Arthurs, MD [facility]  Please read the following information regarding your care after surgery.  Medications  You only need a prescription for the narcotic pain medicine (ex. oxycodone, Percocet, Norco).  All of the other medicines listed below are available over the counter. X acetominophen (Tylenol) 650 mg every 4-6 hours as you need for minor pain X oxycodone as prescribed for moderate to severe pain X aleve 220 mg - 2 tablets twice daily with food as needed for pain   Narcotic pain medicine (ex. oxycodone, Percocet, Vicodin) will cause constipation.  To prevent this problem, take  the following medicines while you are taking any pain medicine. X docusate sodium (Colace) 100 mg twice a day X senna (Senokot) 2 tablets twice a day  X To help prevent blood clots, take a baby aspirin (81 mg) twice a day after surgery.  You should also get up every hour while you are awake to move around.    Weight Bearing X Do not bear any weight on the operated leg or foot.  Cast / Splint / Dressing X Keep  your splint or cast clean and dry.  Dont put anything (coat hanger, pencil, etc) down inside of it.  If it gets damp, use a hair dryer on the cool setting to dry it.  If it gets soaked, call the office to schedule an appointment for a cast change.    After your dressing, cast or splint is removed; you may shower, but do not soak or scrub the wound.  Allow the water to run over it, and then gently pat it dry.  Swelling It is normal for you to have swelling where you had surgery.  To reduce swelling and pain, keep your toes above your nose for at least 3 days after surgery.  It may be necessary to keep your foot or leg elevated for several weeks.  If it hurts, it should be elevated.  Follow Up Call my office at [phone] when you are discharged from the hospital or surgery center to schedule an appointment to be seen two weeks after surgery.  Call my office at [phone] if you develop a fever >101.5 F, nausea, vomiting, bleeding from the surgical site or severe pain.

## 2017-02-08 NOTE — Brief Op Note (Signed)
02/08/2017  10:39 AM  PATIENT:  Ruben Poole  61 y.o. male  PRE-OPERATIVE DIAGNOSIS:  Left ankle bimalleolar fracture  POST-OPERATIVE DIAGNOSIS:  Left ankle bimalleolar fracture  Procedure(s): 1.  OPEN treatment of left ankle BIMALLEOLAR fracture with internal fixation 2.  AP, mortise and lateral xrays of the left ankle 3.  Stress exam of the left ankle under fluoroscopy  SURGEON:  Toni Arthurs, MD  ASSISTANT: Alfredo Martinez, PA-C  ANESTHESIA:   General, regional  EBL:  minimal   TOURNIQUET:   Total Tourniquet Time Documented: Thigh (Left) - 35 minutes Total: Thigh (Left) - 35 minutes   COMPLICATIONS:  None apparent  DISPOSITION:  Extubated, awake and stable to recovery.  DICTATION ID:  [id number]

## 2017-02-08 NOTE — Op Note (Signed)
NAMEBHAVIN, Ruben Poole              ACCOUNT NO.:  [id number]  MEDICAL RECORD NO.:  [id number]  LOCATION:                                 FACILITY:  PHYSICIAN:  Toni Arthurs, MD        DATE OF BIRTH:  11/13/1955  DATE OF PROCEDURE:  02/08/2017 DATE OF DISCHARGE:                              OPERATIVE REPORT   PREOPERATIVE DIAGNOSIS:  Left ankle bimalleolar fracture.  POSTOPERATIVE DIAGNOSIS:  Left ankle bimalleolar fracture.  PROCEDURE: 1. Open treatment of left ankle bimalleolar fracture with internal     fixation. 2. AP, mortise, and lateral radiographs of the left ankle. 3. Stress examination of the left ankle under fluoroscopy.  SURGEON:  Toni Arthurs, MD.  ASSISTANT:  Alfredo Martinez, PA-C.  ANESTHESIA:  General, regional.  ESTIMATED BLOOD LOSS:  Minimal.  TOURNIQUET TIME:  35 minutes at 250 mmHg.  COMPLICATIONS:  None apparent.  DISPOSITION:  Extubated, awake, and stable to recovery.  INDICATIONS FOR PROCEDURE:  The patient is a 61 year old male, who fell off his deck while trimming a tree limb.  He injured his left ankle.  He had a fracture dislocation and was seen in the emergency department.  He underwent closed reduction and was splinted.  He presents today for operative treatment of this displaced and unstable left ankle injury. He understands the risks and benefits of the alternative treatment options and elects surgical treatment.  He specifically understands the risks of bleeding, infection, nerve damage, blood clots, need for additional surgery, continued pain, nonunion, amputation, and death.  PROCEDURE IN DETAIL:  After preoperative consent was obtained and the correct operative site was identified, the patient was brought to the operating room and placed supine on the operating table.  General anesthesia was induced.  Preoperative antibiotics were administered. Surgical time-out was taken.  Left lower extremity was prepped and draped in a standard sterile  fashion with tourniquet around the thigh. The extremity was exsanguinated and the tourniquet was inflated to 250 mmHg.  A longitudinal incision was then made over the lateral malleolus and sharp dissection was carried down through skin and subcutaneous tissues.  The fracture site was identified.  It was cleaned of all hematoma and irrigated copiously.  The fracture was reduced and held with a tenaculum.  A 3.5 mm fully threaded lag screw was inserted from posterior to anterior across the fracture site.  It was noted to have excellent purchase and compressed the fracture site appropriately.  A 7- hole one-third tubular plate from the [employer] small frag set was contoured to fit the lateral malleolus.  It was secured proximally with 3 bicortical screws and distally with 3 unicortical screws.  Attention was then turned to the medial malleolus.  An incision was made and dissection was carried down through the skin and subcutaneous tissue.  The fracture hematoma was cleared out with a curette and the wound was irrigated.  The fracture was reduced and held with a tenaculum.  K-wires were inserted across the fracture site from the tip of the medial malleolus.  AP and lateral radiographs confirmed appropriate reduction and appropriate position and length of both K- wires.  Both K-wires were then over  drilled, and 4 mm x 40 mm partially threaded cannulated screws were inserted.  Both were noted to have excellent purchase and compressed the fracture site appropriately. Guide pins were removed.  AP, mortise, and lateral radiographs of the left ankle showed appropriate position and length of all hardware and appropriate reduction of the medial and lateral malleolus fractures.  Stress examination was then performed with dorsiflexion and external rotation stress.  No widening of the syndesmosis or ankle mortise was noted.  Both wounds were then irrigated copiously.  2-0 Vicryl, 3-0 Monocryl, and 3-0  nylon were used to close the incision.  Sterile dressings were applied followed by a well-padded short-leg splint. Tourniquet was released after application of the dressings at 35 minutes.  The patient was awakened from anesthesia and transported to the recovery room in stable condition.  FOLLOWUP PLAN:  The patient will be nonweightbearing on the left lower extremity.  He will follow up with me in the office in 2 weeks for suture removal and conversion to a CAM walker boot to initiate early range of motion and weightbearing.  Alfredo Martinez, PA-C, was present and scrubbed for the duration of the case.  His assistance was essential in positioning the patient, prepping and draping, gaining and maintaining exposure, performing the operation, closing and dressing the wounds, and applying the splint.  X-RAYS:  AP, mortise, and lateral radiographs of the left ankle were obtained intraoperatively.  These show interval reduction and fixation of the left ankle bimalleolar fracture.  No other acute injuries are noted.     Toni Arthurs, MD     JH/MEDQ  D:  02/08/2017  T:  02/08/2017  Job:  [id number]

## 2017-02-08 NOTE — H&P (Signed)
Ruben Poole is an 61 y.o. male.   Chief Complaint: left ankle pain HPI: 61 y/o male with left ankle fracture after a fall from his deck last week.  He presents now for ORIF of this unstable bimal ankle fracture dislocation.  Past Medical History:  Diagnosis Date  . Abrasion of arm, left 01/31/2017  . Bimalleolar ankle fracture 01/31/2017   left  . Sleep apnea    had sleep study 07/2014:  severe sleep apnea; delayed sleep prevented application of split protocol CPAP titration; pt. never went back for followup    Past Surgical History:  Procedure Laterality Date  . COLONOSCOPY WITH PROPOFOL  08/04/2014  . WISDOM TOOTH EXTRACTION  1990    History reviewed. No pertinent family history. Social History:  reports that he quit smoking about 15 months ago. He has never used smokeless tobacco. He reports that he drinks alcohol. He reports that he does not use drugs.  Allergies: No Known Allergies  Medications Prior to Admission  Medication Sig Dispense Refill  . cholecalciferol (VITAMIN D) 1000 units tablet Take 1,000 Units by mouth daily.    . Multiple Vitamin (MULTIVITAMIN) tablet Take 1 tablet by mouth daily.    . naproxen sodium (ANAPROX) 220 MG tablet Take 220 mg by mouth 2 (two) times daily with a meal.    . oxyCODONE-acetaminophen (PERCOCET) 5-325 MG tablet Take 1 tablet by mouth every 4 (four) hours as needed. 20 tablet 0  . vitamin E 400 UNIT capsule Take 400 Units by mouth daily.      No results found for this or any previous visit (from the past 48 hour(s)). No results found.  ROS  No recent f/c/n/v/wt loss  Blood pressure (!) 144/81, pulse 97, temperature 99.1 F (37.3 C), temperature source Oral, resp. rate (!) 21, height  (1.753 m), weight 115.7 kg (255 lb), SpO2 97 %. Physical Exam  wn wd male in nad.  A and O x 4.  Mood and affect normal.  EOMI.  resp unlabored.  L ankle with swelling and ecchymosis.  Skin o/w healthy and intact.  No lymphadenopathy.  5/5  strength in PF and DF fot he toes.  Sens to LT intact a tthe foot dorsally and plantarly.  Brisk cap refill at the toes.  Assessment/Plan L ankle bimal fracture dislocation - to OR for ORIF.  The risks and benefits of the alternative treatment options have been discussed in detail.  The patient wishes to proceed with surgery and specifically understands risks of bleeding, infection, nerve damage, blood clots, need for additional surgery, amputation and death.   Toni Arthurs, MD 02/25/2017, 7:24 AM

## 2017-02-08 NOTE — Transfer of Care (Signed)
Immediate Anesthesia Transfer of Care Note  Patient: Ruben Poole  Procedure(s) Performed: Procedure(s): OPEN REDUCTION INTERNAL FIXATION (ORIF)  BIMALLEOLAR ANKLE FRACTURE (Left)  Patient Location: PACU  Anesthesia Type:GA combined with regional for post-op pain  Level of Consciousness: awake and patient cooperative  Airway & Oxygen Therapy: Patient Spontanous Breathing and Patient connected to face mask oxygen  Post-op Assessment: Report given to RN and Post -op Vital signs reviewed and stable  Post vital signs: Reviewed and stable  Last Vitals:  Vitals:   02/08/17 0724 02/08/17 0725  BP:    Pulse: 86 85  Resp: 20 14  Temp:      Last Pain:  Vitals:   02/08/17 0641  TempSrc: Oral         Complications: No apparent anesthesia complications

## 2017-02-08 NOTE — Anesthesia Procedure Notes (Signed)
Anesthesia Regional Block: Adductor canal block   Pre-Anesthetic Checklist: ,, timeout performed, Correct Patient, Correct Site, Correct Laterality, Correct Procedure, Correct Position, site marked, Risks and benefits discussed,  Surgical consent,  Pre-op evaluation,  At surgeon's request and post-op pain management  Laterality: Lower and Left  Prep: chloraprep       Needles:  Injection technique: Single-shot  Needle Type: Echogenic Stimulator Needle          Additional Needles:   Procedures: ultrasound guided,,,,,,,,  Narrative:  Start time: 02/08/2017 7:09 AM End time: 02/08/2017 7:16 AM Injection made incrementally with aspirations every 5 mL.  Performed by: Personally  Anesthesiologist: Seon Gaertner  Additional Notes: H+P and labs reviewed, risks and benefits discussed with patient, procedure tolerated well without complications

## 2017-02-08 NOTE — Anesthesia Procedure Notes (Signed)
Anesthesia Regional Block: Popliteal block   Pre-Anesthetic Checklist: ,, timeout performed, Correct Patient, Correct Site, Correct Laterality, Correct Procedure, Correct Position, site marked, Risks and benefits discussed,  Surgical consent,  Pre-op evaluation,  At surgeon's request and post-op pain management  Laterality: Lower and Left  Prep: chloraprep       Needles:  Injection technique: Single-shot  Needle Type: Echogenic Stimulator Needle          Additional Needles:   Procedures: ultrasound guided,,,,,,,,  Narrative:  Start time: 02/08/2017 7:09 AM End time: 02/08/2017 7:16 AM Injection made incrementally with aspirations every 5 mL.  Performed by: Personally  Anesthesiologist: Mar Walmer  Additional Notes: H+P and labs reviewed, risks and benefits discussed with patient, procedure tolerated well without complications

## 2017-02-08 NOTE — Progress Notes (Signed)
AssistedDr. Moser with left, ultrasound guided, popliteal, adductor canal block. Side rails up, monitors on throughout procedure. See vital signs in flow sheet. Tolerated Procedure well.  

## 2017-02-08 NOTE — Anesthesia Postprocedure Evaluation (Addendum)
Anesthesia Post Note  Patient: Ruben Poole  Procedure(s) Performed: Procedure(s) (LRB): OPEN REDUCTION INTERNAL FIXATION (ORIF)  BIMALLEOLAR ANKLE FRACTURE (Left)  Patient location during evaluation: PACU Anesthesia Type: Regional Level of consciousness: awake and alert Pain management: pain level controlled Vital Signs Assessment: post-procedure vital signs reviewed and stable Respiratory status: spontaneous breathing, nonlabored ventilation, respiratory function stable and patient connected to nasal cannula oxygen Cardiovascular status: blood pressure returned to baseline and stable Postop Assessment: no signs of nausea or vomiting Anesthetic complications: no       Last Vitals:  Vitals:   02/08/17 0845 02/08/17 0900  BP: 119/68 130/70  Pulse: 95 (!) 109  Resp: 16 (!) 22  Temp:      Last Pain:  Vitals:   02/08/17 0900  TempSrc:   PainSc: 0-No pain                 Adylin Hankey

## 2017-02-08 NOTE — Anesthesia Procedure Notes (Signed)
Procedure Name: LMA Insertion Date/Time: 02/08/2017 7:37 AM Performed by: Teal Bontrager D Pre-anesthesia Checklist: Patient identified, Emergency Drugs available, Suction available and Patient being monitored Patient Re-evaluated:Patient Re-evaluated prior to inductionOxygen Delivery Method: Circle system utilized Preoxygenation: Pre-oxygenation with 100% oxygen Intubation Type: IV induction Ventilation: Mask ventilation without difficulty LMA: LMA inserted LMA Size: 5.0 Number of attempts: 1 Airway Equipment and Method: Bite block Placement Confirmation: positive ETCO2 Tube secured with: Tape Dental Injury: Teeth and Oropharynx as per pre-operative assessment

## 2017-02-08 NOTE — Anesthesia Preprocedure Evaluation (Addendum)
Anesthesia Evaluation  Patient identified by MRN, date of birth, ID band Patient awake    Reviewed: Allergy & Precautions, NPO status , Patient's Chart, lab work & pertinent test results  History of Anesthesia Complications Negative for: history of anesthetic complications  Airway Mallampati: II  TM Distance: >3 FB Neck ROM: Full    Dental  (+) Teeth Intact, Missing   Pulmonary sleep apnea , former smoker,    breath sounds clear to auscultation       Cardiovascular negative cardio ROS   Rhythm:Regular     Neuro/Psych negative neurological ROS  negative psych ROS   GI/Hepatic negative GI ROS, Neg liver ROS,   Endo/Other  Morbid obesity  Renal/GU negative Renal ROS     Musculoskeletal Left ankle fx    Abdominal   Peds  Hematology   Anesthesia Other Findings   Reproductive/Obstetrics                            Anesthesia Physical Anesthesia Plan  ASA: II  Anesthesia Plan: General and Regional   Post-op Pain Management:  Regional for Post-op pain   Induction: Intravenous  Airway Management Planned: Oral ETT and LMA  Additional Equipment: None  Intra-op Plan:   Post-operative Plan: Extubation in OR  Informed Consent: I have reviewed the patients History and Physical, chart, labs and discussed the procedure including the risks, benefits and alternatives for the proposed anesthesia with the patient or authorized representative who has indicated his/her understanding and acceptance.   Dental advisory given  Plan Discussed with: CRNA and Surgeon  Anesthesia Plan Comments:         Anesthesia Quick Evaluation

## 2017-02-09 ENCOUNTER — Encounter (HOSPITAL_BASED_OUTPATIENT_CLINIC_OR_DEPARTMENT_OTHER): Payer: Self-pay | Admitting: Orthopedic Surgery

## 2017-04-09 NOTE — Addendum Note (Signed)
Addendum  created 04/09/17 1325 by Liticia Gasior, MD   Sign clinical note    

## 2018-06-09 IMAGING — CT CT ANKLE*L* W/O CM
4 of 8 series · 10 of 33 positions shown, 11 images · non-contrast
Comparison: None.

CLINICAL DATA: Bimalleolar fracture of the left ankle

EXAM:
CT OF THE LEFT ANKLE WITHOUT CONTRAST
TECHNIQUE: Multidetector CT imaging of the left ankle was performed according
to the standard protocol. Multiplanar CT image reconstructions were
also generated.

[Series 3: foot/ankle bone windows · axial · 0.29mm/px · z∈[+190,+250]mm · 2 of 92 slices shown, 3 images]
[im 31/92  soft-tissue]
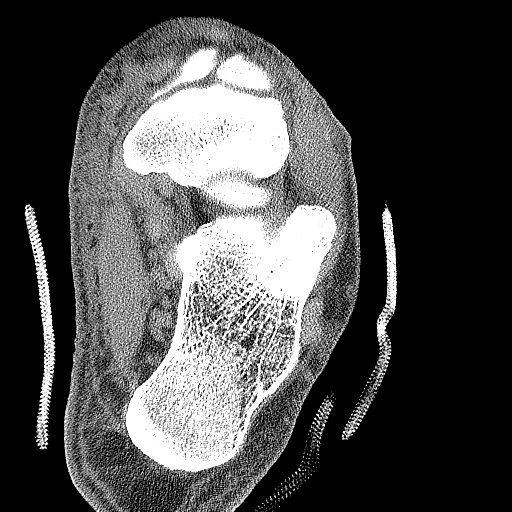
[im 31/92  bone]
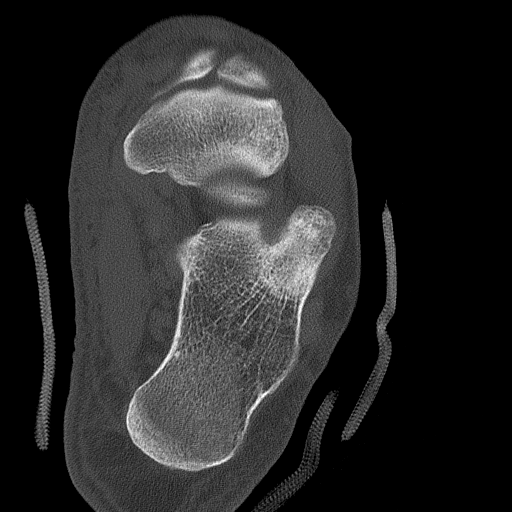
[im 61/92  bone]
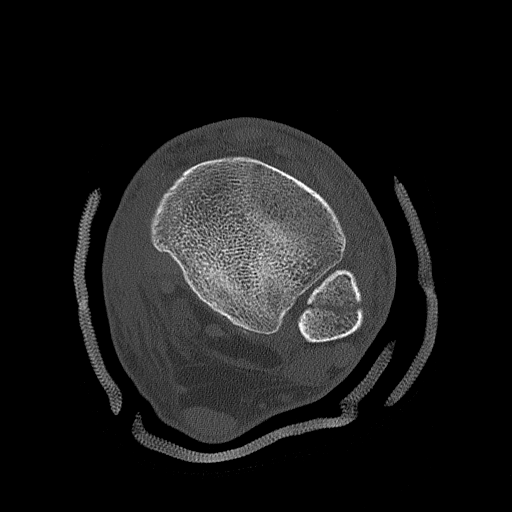

[Series 4: foot/ankle st · axial · 0.29mm/px · z∈[+190,+250]mm · 2 of 92 slices shown]
[im 31/92  bone]
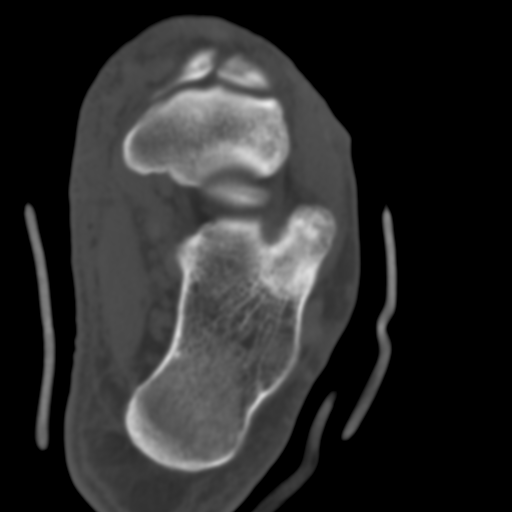
[im 61/92  bone]
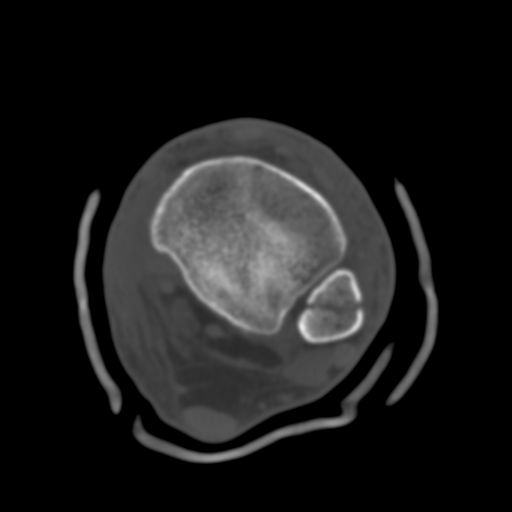

[Series 7: coronal bone · coronal · 0.24mm/px · 1 of 76 slices shown]
[im 38/76  bone]
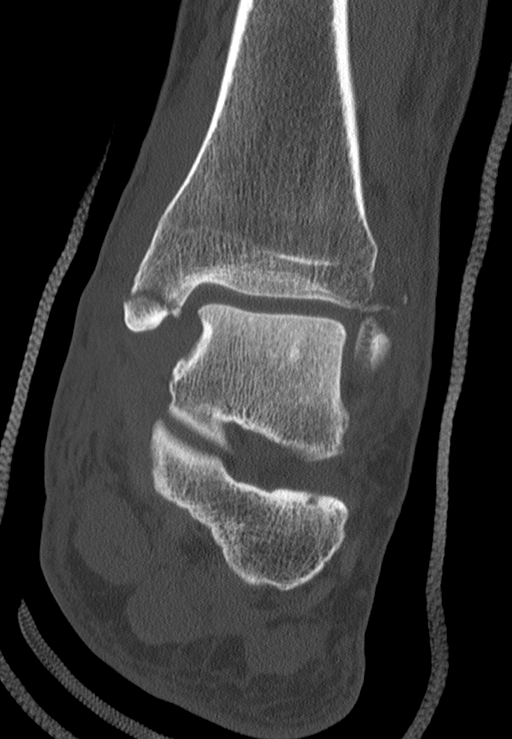

[Series 604: <mpr thick range(2)> · sagittal · 0.36mm/px · 5 of 50 slices shown]
[im 9/50  bone]
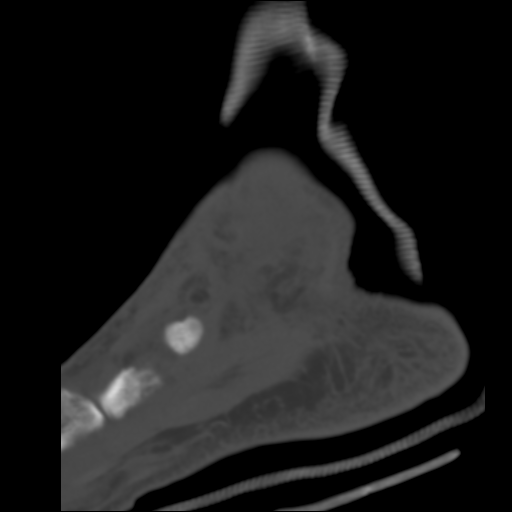
[im 17/50  bone]
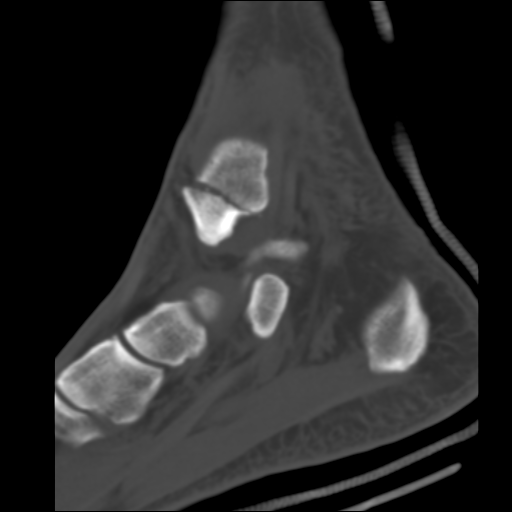
[im 25/50  bone]
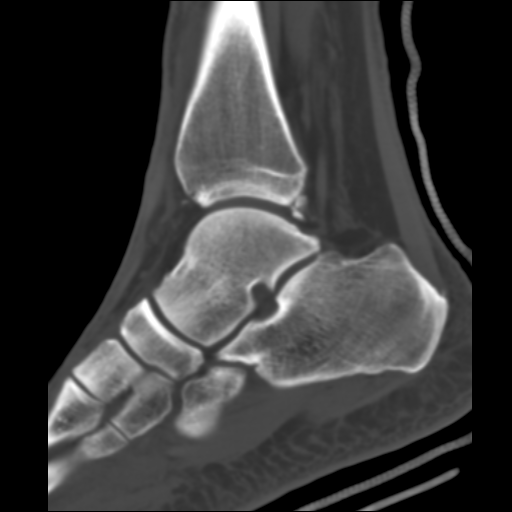
[im 33/50  bone]
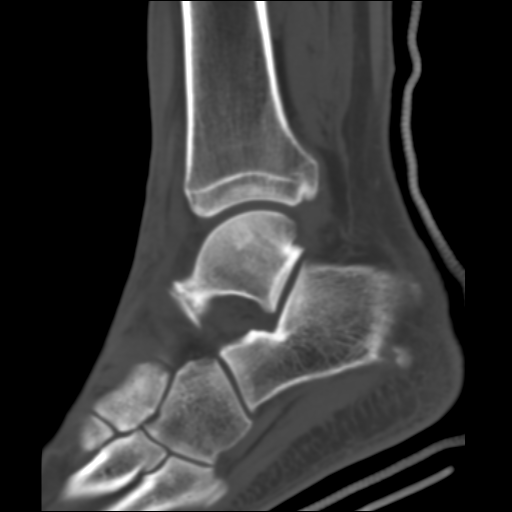
[im 41/50  bone]
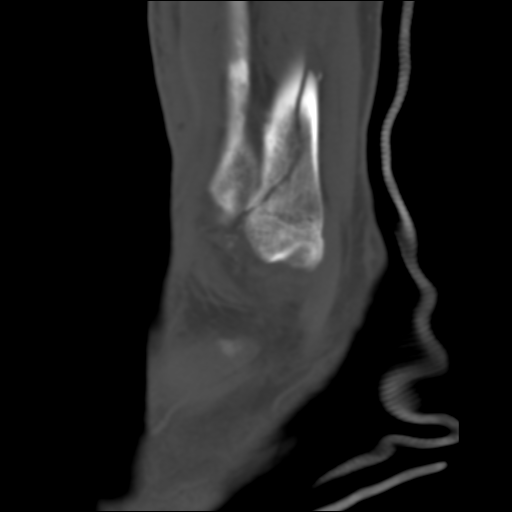

[10 of 33 positions shown; findings below may reference images not displayed]

FINDINGS: Bones/Joint/Cartilage

Acute, closed, bimalleolar fractures of the ankle are identified.
The fibular fracture is slightly comminuted with main fracture
coronal oblique in orientation extending into the ankle joint. There
is a 14 mm length dorsally displaced fracture off the upper portion
of the fibular fracture. The fracture involving the medial malleolus
of the tibia involves the anterolateral corner and there is slight
medial displacement of the medial malleolus by approximately 5 mm.
The ankle mortise is minimally widened along its medial aspect as a
result of to 6 mm but grossly intact. There are tiny ossific
densities posterior to the posterior malleolus without donor site,
possibly related to old remote trauma. The visualized talus, os
calcaneus and midfoot are intact.

Ligaments

Suboptimally assessed by CT.

Muscles and Tendons

No acute intramuscular hemorrhage nor evidence of compartment
syndrome. The visualized flexor and extensor tendons are grossly
intact.

Soft tissues

Expected degree of moderate subcutaneous soft tissue edema and
induration about the ankle secondary to fracture.
IMPRESSION: 1. Acute, closed, bimalleolar fractures of the ankle.
2. The fibular fracture is slightly comminuted proximally with
slight dorsal displacement of a 14 mm in length fracture fragment.
Slight medial displacement of the medial malleolar fracture by 5 mm.
3. Slight asymmetric widening of the ankle mortise medially to 6 mm.

## 2018-06-09 IMAGING — DX DG ANKLE 2V *L*
2 series · 2 of 2 positions shown · non-contrast
Comparison: LEFT ankle radiograph January 31, 2017 at 1884 hours

CLINICAL DATA: Closed reduction LEFT ankle fracture.

EXAM:
LEFT ANKLE - 2 VIEW

[ankle ap]
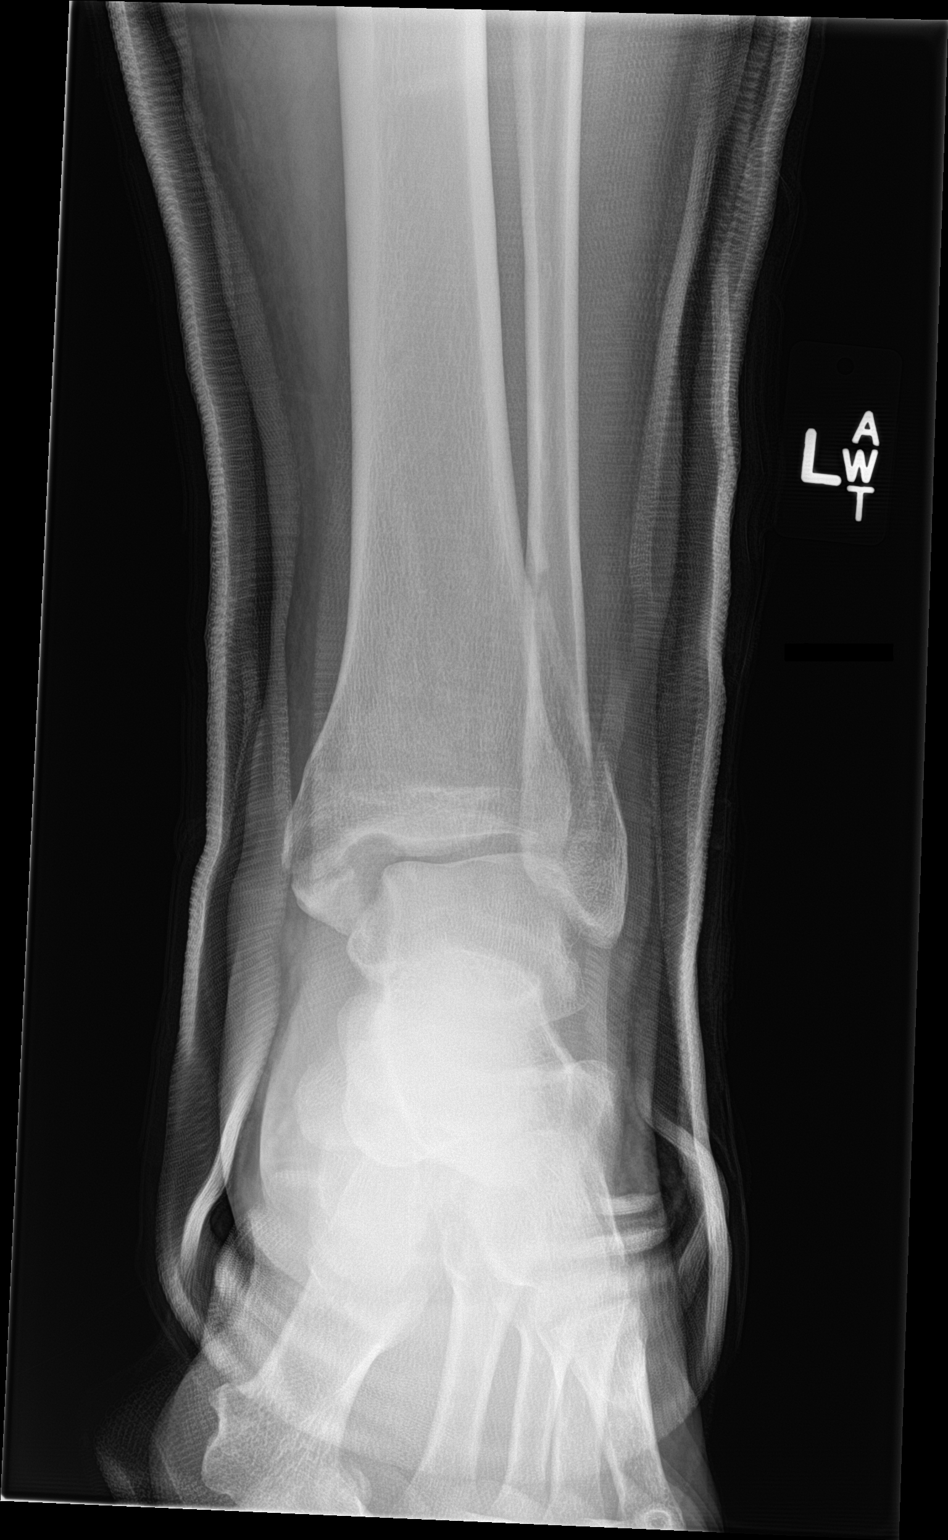

[ankle lat]
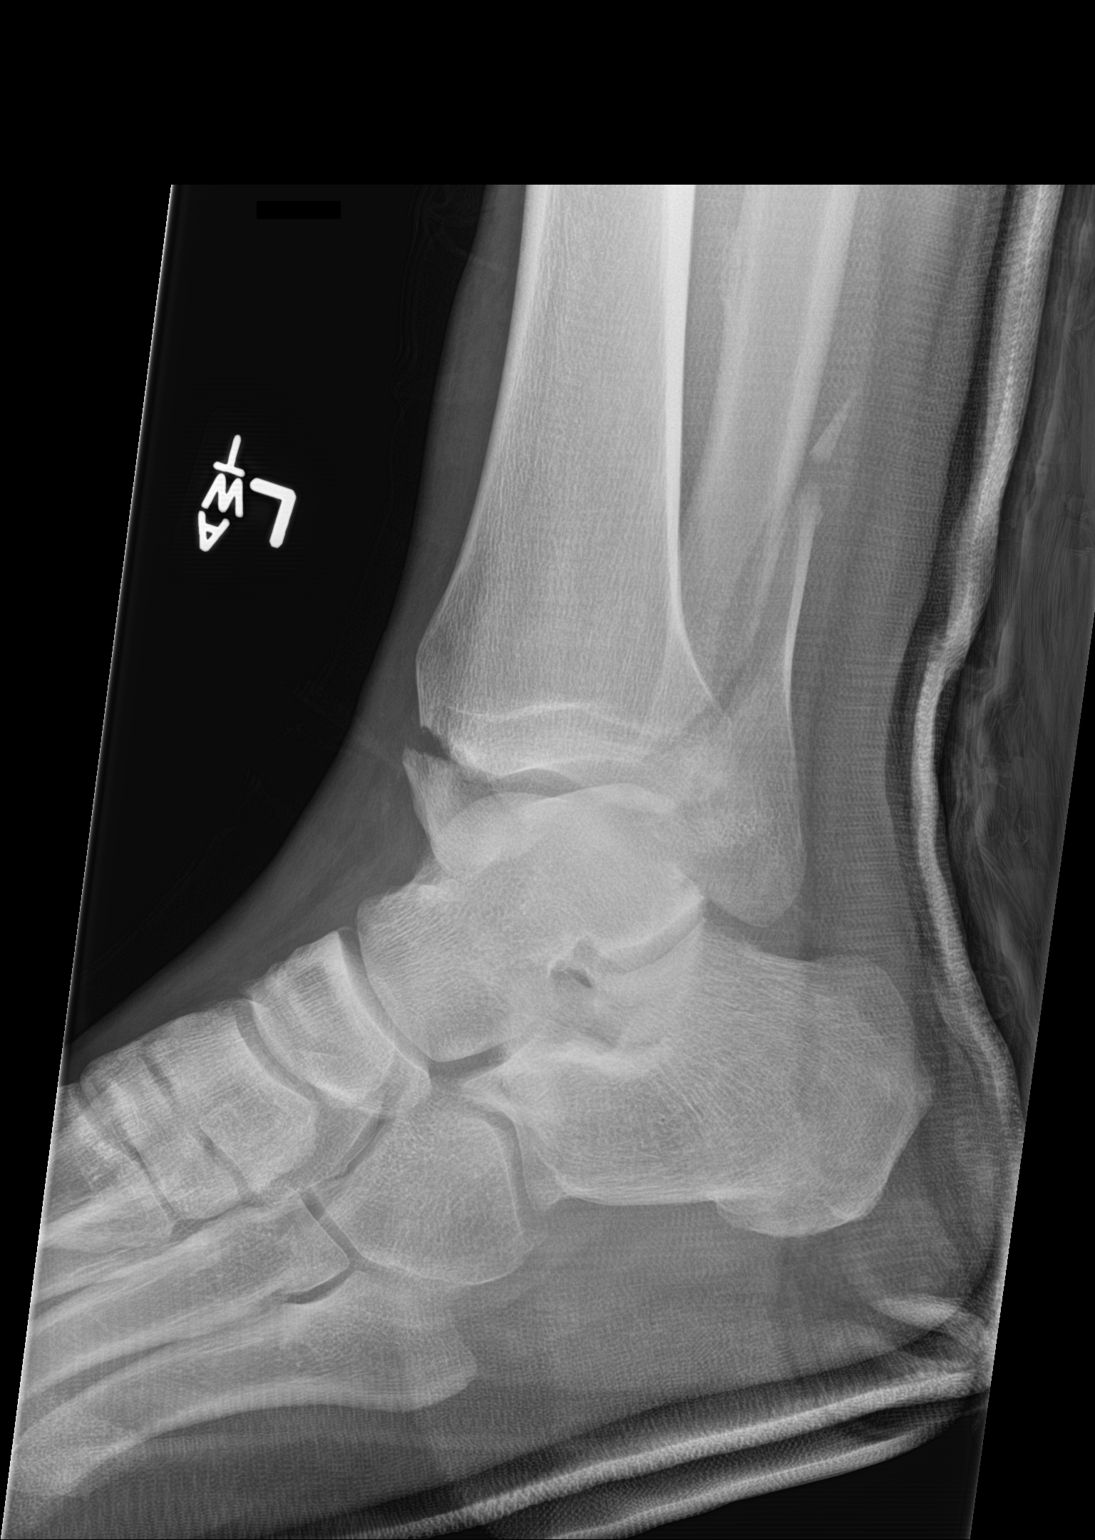

[2 of 2 positions shown; findings below may reference images not displayed]

FINDINGS: Acute medial malleolus with slight lateral angulation. Nondisplaced
acute distal fibular fracture. Mildly widened medial clear space,
improved from prior examination. No dislocation. No destructive bony
lesions. Soft tissue swelling. Thick fiberglass splint obscures the
fine bony detail.
IMPRESSION: Acute medial malleolus and distal fibular fractures in improved
alignment, in fiberglass splint.

## 2020-03-30 DIAGNOSIS — H43313 Vitreous membranes and strands, bilateral: Secondary | ICD-10-CM | POA: Diagnosis not present

## 2020-05-31 DIAGNOSIS — M25551 Pain in right hip: Secondary | ICD-10-CM | POA: Diagnosis not present

## 2020-05-31 DIAGNOSIS — M545 Low back pain: Secondary | ICD-10-CM | POA: Diagnosis not present

## 2020-06-02 DIAGNOSIS — M25551 Pain in right hip: Secondary | ICD-10-CM | POA: Diagnosis not present

## 2020-06-25 DIAGNOSIS — M25531 Pain in right wrist: Secondary | ICD-10-CM | POA: Diagnosis not present

## 2020-06-25 DIAGNOSIS — R2232 Localized swelling, mass and lump, left upper limb: Secondary | ICD-10-CM | POA: Diagnosis not present

## 2020-06-25 DIAGNOSIS — R2231 Localized swelling, mass and lump, right upper limb: Secondary | ICD-10-CM | POA: Diagnosis not present

## 2020-06-25 DIAGNOSIS — M79645 Pain in left finger(s): Secondary | ICD-10-CM | POA: Diagnosis not present

## 2020-07-02 DIAGNOSIS — M79645 Pain in left finger(s): Secondary | ICD-10-CM | POA: Diagnosis not present

## 2020-07-07 DIAGNOSIS — M79645 Pain in left finger(s): Secondary | ICD-10-CM | POA: Diagnosis not present

## 2020-08-24 DIAGNOSIS — L80 Vitiligo: Secondary | ICD-10-CM | POA: Diagnosis not present

## 2020-08-24 DIAGNOSIS — L718 Other rosacea: Secondary | ICD-10-CM | POA: Diagnosis not present

## 2020-09-21 DIAGNOSIS — H40019 Open angle with borderline findings, low risk, unspecified eye: Secondary | ICD-10-CM | POA: Diagnosis not present

## 2020-10-20 DIAGNOSIS — Z125 Encounter for screening for malignant neoplasm of prostate: Secondary | ICD-10-CM | POA: Diagnosis not present

## 2020-10-20 DIAGNOSIS — Z13220 Encounter for screening for lipoid disorders: Secondary | ICD-10-CM | POA: Diagnosis not present

## 2020-10-20 DIAGNOSIS — I10 Essential (primary) hypertension: Secondary | ICD-10-CM | POA: Diagnosis not present

## 2021-03-03 ENCOUNTER — Ambulatory Visit (INDEPENDENT_AMBULATORY_CARE_PROVIDER_SITE_OTHER): Payer: No Typology Code available for payment source

## 2021-03-03 ENCOUNTER — Ambulatory Visit (INDEPENDENT_AMBULATORY_CARE_PROVIDER_SITE_OTHER): Payer: No Typology Code available for payment source | Admitting: Podiatry

## 2021-03-03 ENCOUNTER — Other Ambulatory Visit: Payer: Self-pay

## 2021-03-03 ENCOUNTER — Encounter: Payer: Self-pay | Admitting: Podiatry

## 2021-03-03 DIAGNOSIS — M21612 Bunion of left foot: Secondary | ICD-10-CM

## 2021-03-03 DIAGNOSIS — M21611 Bunion of right foot: Secondary | ICD-10-CM

## 2021-03-03 DIAGNOSIS — M24072 Loose body in left ankle: Secondary | ICD-10-CM | POA: Diagnosis not present

## 2021-03-03 DIAGNOSIS — S82892S Other fracture of left lower leg, sequela: Secondary | ICD-10-CM

## 2021-03-03 DIAGNOSIS — M79672 Pain in left foot: Secondary | ICD-10-CM | POA: Diagnosis not present

## 2021-03-03 DIAGNOSIS — B351 Tinea unguium: Secondary | ICD-10-CM

## 2021-03-03 DIAGNOSIS — M2012 Hallux valgus (acquired), left foot: Secondary | ICD-10-CM

## 2021-03-03 DIAGNOSIS — M2011 Hallux valgus (acquired), right foot: Secondary | ICD-10-CM

## 2021-03-03 MED ORDER — TERBINAFINE HCL 250 MG PO TABS: 250.0000 mg | ORAL_TABLET | Freq: Every day | ORAL | 0 refills | Status: AC

## 2021-03-03 MED ORDER — CICLOPIROX 8 % EX SOLN: Freq: Every day | CUTANEOUS | 5 refills | Status: DC

## 2021-03-03 NOTE — Patient Instructions (Signed)
The procedure we discussed is called a Lapidus procedure

## 2021-03-03 NOTE — Progress Notes (Signed)
  Subjective:  Patient ID: Ruben Poole, male    DOB: 1956/01/06,  MRN: [id number]  Chief Complaint  Patient presents with  . Nail Problem    Thick and discolored nails     65 y.o. male presents with the above complaint. History confirmed with patient.  He also complains of painful bunions and hammertoes that have been worsening.  His left foot is worse than the right.  He also has history of ankle fracture on the left side and has pain and clicking and popping in the front of the ankle joint.  Objective:  Physical Exam: warm, good capillary refill, no trophic changes or ulcerative lesions, normal DP and PT pulses and normal sensory exam.  He has onychomycosis with thickened discolored dystrophic toenails.  He has moderate to severe hallux valgus deformity bilaterally with left worse than right.  Hammertoe deformities which are similar reducible in nature.  Palpable hardware left ankle with well-healed surgical incision.  He has some pain and crepitance with the dorsiflexion of the ankle in the anterior joint  Radiographs: X-ray of bilateral foot and left ankle: Bilateral he has left worse than right moderate to severe hallux abductovalgus with mild metatarsus adductus deformity, hammertoe deformities.  He has a anterior joint space narrowing of the left ankle with a visible loose body in the joint Assessment:   1. Onychomycosis   2. Hallux valgus with bunions, left   3. Hallux valgus with bunions, right   4. Closed fracture of left ankle, sequela   5. Loose body of ankle joint, left      Plan:  Patient was evaluated and treated and all questions answered.  Discussed etiology treatment options of nail fungus with him in detail.  Recommended oral and topical treatment with Lamisil and Penlac.  This was sent to his pharmacy.  Discussed risk, benefits and potential side effects from this.  Discussed the etiology and treatment including surgical and non surgical treatment for painful  bunions and hammertoes.  He has exhausted all non surgical treatment prior to this visit including shoe gear changes and padding.  He desires surgical intervention. We discussed all risks including but not limited to: pain, swelling, infection, scar, numbness which may be temporary or permanent, chronic pain, stiffness, nerve pain or damage, wound healing problems, bone healing problems including delayed or non-union and recurrence. Specifically we discussed the following procedures: Lapidus bunionectomy with hammertoe correction of the left foot.  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  Discussed arthroscopic evaluation and excision of the loose body with him.  Not currently although painful for him and he will consider this for the future if it worsens.   Return in about 4 months (around 07/03/2021) for follow up nail fungus, discuss bunion surgery .

## 2021-07-04 ENCOUNTER — Ambulatory Visit: Payer: No Typology Code available for payment source | Admitting: Podiatry

## 2022-03-18 ENCOUNTER — Other Ambulatory Visit: Payer: Self-pay | Admitting: Podiatry

## 2022-04-06 ENCOUNTER — Other Ambulatory Visit (HOSPITAL_COMMUNITY): Payer: Self-pay | Admitting: Family Medicine

## 2022-04-06 ENCOUNTER — Other Ambulatory Visit: Payer: Self-pay | Admitting: Family Medicine

## 2022-04-06 DIAGNOSIS — E7800 Pure hypercholesterolemia, unspecified: Secondary | ICD-10-CM

## 2022-04-17 ENCOUNTER — Ambulatory Visit (HOSPITAL_COMMUNITY)
Admission: RE | Admit: 2022-04-17 | Discharge: 2022-04-17 | Disposition: A | Discharge status: Home / Self Care | Payer: Self-pay | Source: Ambulatory Visit | Attending: Family Medicine | Admitting: Family Medicine

## 2022-04-17 DIAGNOSIS — E7800 Pure hypercholesterolemia, unspecified: Secondary | ICD-10-CM | POA: Insufficient documentation

## 2022-12-13 DIAGNOSIS — M25551 Pain in right hip: Secondary | ICD-10-CM | POA: Diagnosis not present

## 2022-12-13 DIAGNOSIS — M5451 Vertebrogenic low back pain: Secondary | ICD-10-CM | POA: Diagnosis not present

## 2023-01-03 DIAGNOSIS — M1612 Unilateral primary osteoarthritis, left hip: Secondary | ICD-10-CM | POA: Diagnosis not present

## 2023-01-03 DIAGNOSIS — Z96641 Presence of right artificial hip joint: Secondary | ICD-10-CM | POA: Diagnosis not present

## 2023-02-07 DIAGNOSIS — M25552 Pain in left hip: Secondary | ICD-10-CM | POA: Diagnosis not present

## 2023-02-15 DIAGNOSIS — M5416 Radiculopathy, lumbar region: Secondary | ICD-10-CM | POA: Diagnosis not present

## 2023-03-08 DIAGNOSIS — M5416 Radiculopathy, lumbar region: Secondary | ICD-10-CM | POA: Diagnosis not present

## 2023-04-03 DIAGNOSIS — M5136 Other intervertebral disc degeneration, lumbar region: Secondary | ICD-10-CM | POA: Diagnosis not present

## 2023-04-03 DIAGNOSIS — M5416 Radiculopathy, lumbar region: Secondary | ICD-10-CM | POA: Diagnosis not present

## 2024-10-06 ENCOUNTER — Encounter: Payer: Self-pay | Admitting: Internal Medicine

## 2024-12-11 ENCOUNTER — Telehealth: Payer: Self-pay

## 2024-12-11 NOTE — Telephone Encounter (Signed)
 Called and spoke with patient-advised patient of need for recall colon-patient reports he does not have a care partner lined up at this time and he will need to call back to the office once that is lined up; Patient advised to call back to the office at [phone] should questions/concerns arise; Patient verbalized understanding of information/instructions;
